# Patient Record
Sex: Male | Born: 1990 | Race: Black or African American | Hispanic: No | Marital: Single | Smoking: Never smoker
Health system: Southern US, Community
[De-identification: ages and names within clinical notes are randomized; demographics above are authoritative.]

---
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
## 2017-01-12 ENCOUNTER — Encounter (HOSPITAL_COMMUNITY): Payer: Self-pay | Admitting: *Deleted

## 2017-01-12 ENCOUNTER — Emergency Department (HOSPITAL_COMMUNITY): Payer: Non-veteran care

## 2017-01-12 ENCOUNTER — Emergency Department (HOSPITAL_COMMUNITY)
Admission: EM | Admit: 2017-01-12 | Discharge: 2017-01-12 | Disposition: A | Discharge status: Home / Self Care | Payer: Non-veteran care | Attending: Emergency Medicine | Admitting: Emergency Medicine

## 2017-01-12 DIAGNOSIS — W228XXA Striking against or struck by other objects, initial encounter: Secondary | ICD-10-CM | POA: Insufficient documentation

## 2017-01-12 DIAGNOSIS — S6991XA Unspecified injury of right wrist, hand and finger(s), initial encounter: Secondary | ICD-10-CM | POA: Diagnosis present

## 2017-01-12 DIAGNOSIS — Y929 Unspecified place or not applicable: Secondary | ICD-10-CM | POA: Diagnosis not present

## 2017-01-12 DIAGNOSIS — Y999 Unspecified external cause status: Secondary | ICD-10-CM | POA: Insufficient documentation

## 2017-01-12 DIAGNOSIS — S6010XA Contusion of unspecified finger with damage to nail, initial encounter: Secondary | ICD-10-CM

## 2017-01-12 DIAGNOSIS — S62600A Fracture of unspecified phalanx of right index finger, initial encounter for closed fracture: Secondary | ICD-10-CM

## 2017-01-12 DIAGNOSIS — Y939 Activity, unspecified: Secondary | ICD-10-CM | POA: Insufficient documentation

## 2017-01-12 DIAGNOSIS — S62630A Displaced fracture of distal phalanx of right index finger, initial encounter for closed fracture: Secondary | ICD-10-CM | POA: Insufficient documentation

## 2017-01-12 MED ORDER — IBUPROFEN 600 MG PO TABS: 600.0000 mg | ORAL_TABLET | Freq: Four times a day (QID) | ORAL | 0 refills | Status: AC

## 2017-01-12 MED ORDER — HYDROCODONE-ACETAMINOPHEN 5-325 MG PO TABS
2.0000 | ORAL_TABLET | Freq: Once | ORAL | Status: AC
  Administered 2017-01-12: 2 via ORAL
  Filled 2017-01-12: qty 2

## 2017-01-12 MED ORDER — IBUPROFEN 800 MG PO TABS
800.0000 mg | ORAL_TABLET | Freq: Once | ORAL | Status: AC
  Administered 2017-01-12: 800 mg via ORAL
  Filled 2017-01-12: qty 1

## 2017-01-12 MED ORDER — HYDROCODONE-ACETAMINOPHEN 5-325 MG PO TABS: 1.0000 | ORAL_TABLET | ORAL | 0 refills | Status: AC | PRN

## 2017-01-12 MED ORDER — ONDANSETRON HCL 4 MG PO TABS
4.0000 mg | ORAL_TABLET | Freq: Once | ORAL | Status: AC
  Administered 2017-01-12: 4 mg via ORAL
  Filled 2017-01-12: qty 1

## 2017-01-12 NOTE — ED Notes (Signed)
Patient transported to X-ray 

## 2017-01-12 NOTE — ED Triage Notes (Signed)
Pt shut his right pointer finger in the door of his car. Pt c/o pain and throbbing to his finger.

## 2017-01-12 NOTE — ED Provider Notes (Signed)
[facility]PT Provider Note   CSN: [id number] Arrival date & time: 01/12/17  2029     History   Chief Complaint Chief Complaint  Patient presents with  . Finger Injury    HPI Duane Ray is a 26 y.o. male.  Patient is a 26 year old male who presents to the emergency department with complaint of injury to the index finger.  Patient presents to the emergency department after shutting the right index finger in the door of his car. He complains of throbbing pain in the finger. No other injuries reported. The patient denies being on any anticoagulation medications. He denies any previous operations or procedures involving the right upper extremity. He has not taken anything for his discomfort up to this point.      History reviewed. No pertinent past medical history.  There are no active problems to display for this patient.   History reviewed. No pertinent surgical history.     Home Medications    Prior to Admission medications   Not on File    Family History History reviewed. No pertinent family history.  Social History Social History  Substance Use Topics  . Smoking status: Never Smoker  . Smokeless tobacco: Never Used  . Alcohol use No     Allergies   Patient has no known allergies.   Review of Systems Review of Systems  Constitutional: Negative for activity change and appetite change.  HENT: Negative for congestion, ear discharge, ear pain, facial swelling, nosebleeds, rhinorrhea, sneezing and tinnitus.   Eyes: Negative for photophobia, pain and discharge.  Respiratory: Negative for cough, choking, shortness of breath and wheezing.   Cardiovascular: Negative for chest pain, palpitations and leg swelling.  Gastrointestinal: Negative for abdominal pain, blood in stool, constipation, diarrhea, nausea and vomiting.  Genitourinary: Negative for difficulty urinating, dysuria, flank pain, frequency and hematuria.  Musculoskeletal: Negative for back  pain, gait problem, myalgias and neck pain.       Finger pain  Skin: Negative for color change, rash and wound.  Neurological: Negative for dizziness, seizures, syncope, facial asymmetry, speech difficulty, weakness and numbness.  Hematological: Negative for adenopathy. Does not bruise/bleed easily.  Psychiatric/Behavioral: Negative for agitation, confusion, hallucinations, self-injury and suicidal ideas. The patient is not nervous/anxious.      Physical Exam Updated Vital Signs BP 140/86 (BP Location: Left Arm)   Pulse (!) 56   Temp 98.5 F (36.9 C) (Oral)   Resp 18   Ht 6' (1.829 m)   Wt 67.1 kg   SpO2 100%   BMI 20.07 kg/m   Physical Exam  Constitutional: He is oriented to person, place, and time. He appears well-developed and well-nourished.  Non-toxic appearance.  HENT:  Head: Normocephalic.  Right Ear: Tympanic membrane and external ear normal.  Left Ear: Tympanic membrane and external ear normal.  Eyes: EOM and lids are normal. Pupils are equal, round, and reactive to light.  Neck: Normal range of motion. Neck supple. Carotid bruit is not present.  Cardiovascular: Normal rate, regular rhythm, normal heart sounds, intact distal pulses and normal pulses.   Pulmonary/Chest: Breath sounds normal. No respiratory distress.  Abdominal: Soft. Bowel sounds are normal. There is no tenderness. There is no guarding.  Musculoskeletal: Normal range of motion.  The patient has a subungual hematoma of the right index finger. There is tenderness to the distal tip. There is limited range of motion at the PIP and DIP joint. There is full range of motion of all other fingers, wrists, elbow,  and shoulder on the right. The radial pulses 2+.  Lymphadenopathy:       Head (right side): No submandibular adenopathy present.       Head (left side): No submandibular adenopathy present.    He has no cervical adenopathy.  Neurological: He is alert and oriented to person, place, and time. He has normal  strength. No cranial nerve deficit or sensory deficit.  Skin: Skin is warm and dry.  Psychiatric: He has a normal mood and affect. His speech is normal.  Nursing note and vitals reviewed.    ED Treatments / Results  Labs (all labs ordered are listed, but only abnormal results are displayed) Labs Reviewed - No data to display  EKG  EKG Interpretation None       Radiology No results found.  Procedures .Nail Removal Date/Time: 01/14/2017 10:38 AM Performed by: Ivery Quale Authorized by: Ivery Quale   Consent:    Consent obtained:  Verbal   Consent given by:  Patient   Risks discussed:  Infection and pain Location:    Hand:  R index finger Pre-procedure details:    Skin preparation:  Alcohol and Betadine   Preparation: Patient was prepped and draped in the usual sterile fashion   Anesthesia (see MAR for exact dosages):    Anesthesia method:  None Trephination:    Subungual hematoma drained: yes     Trephination instrument:  Needle Post-procedure details:    Dressing:  Gauze roll   Patient tolerance of procedure:  Tolerated well, no immediate complications .Splint Application Date/Time: 01/12/2017 9:19 PM Performed by: Ivery Quale Authorized by: Ivery Quale   Consent:    Consent obtained:  Verbal   Consent given by:  Patient   Risks discussed:  Pain and swelling Pre-procedure details:    Sensation:  Normal Procedure details:    Laterality:  Right   Location:  Finger   Finger:  R index finger   Splint type:  Finger   Supplies:  Aluminum splint Post-procedure details:    Pain:  Unchanged   Sensation:  Normal   Skin color:  Normal   Patient tolerance of procedure:  Tolerated well, no immediate complications   (including critical care time)  Medications Ordered in ED Medications - No data to display   Initial Impression / Assessment and Plan / ED Course  I have reviewed the triage vital signs and the nursing notes.  Pertinent labs &  imaging results that were available during my care of the patient were reviewed by me and considered in my medical decision making (see chart for details).      Final Clinical Impressions(s) / ED Diagnoses MDM He vital signs within normal limits. The patient has injury of the right index finger. Subungual hematoma evacuated. Splint applied for fx. I've advised patient to keep his hand elevated above his heart to improve his pain. The patient will be provided with an ice pack. Rx for pain medication given.   Final diagnoses:  Fracture of unspecified phalanx of right index finger, initial encounter for closed fracture  Subungual hematoma of digit of hand, initial encounter    New Prescriptions New Prescriptions   No medications on file     Ivery Quale, Cordelia Poche 01/14/17 1045    Bethann Berkshire, MD 01/14/17 1529

## 2017-01-12 NOTE — ED Notes (Signed)
PA at bedside.

## 2017-01-12 NOTE — Discharge Instructions (Signed)
Your x-ray suggests a fracture of the distal tuft of your finger. Please use the finger splint until seen by the orthopedic specialist Dr. Romeo AppleHarrison. You had a collection of blood under the nail called a subungual hematoma that was released. Please change the Band-Aid daily until the bleeding stops. Keep your hand elevated above your heart is much as possible. Use 600 mg of ibuprofen with breakfast, lunch, dinner, and at bedtime. Please useNorco every 4 hours as needed for pain.This medication may cause drowsiness. Please do not drink, drive, or participate in activity that requires concentration while taking this medication.

## 2017-01-12 NOTE — ED Notes (Signed)
Pt verbalized understanding of discharge instructions. Pt ambulatory to waiting room.  
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
## 2019-11-14 ENCOUNTER — Ambulatory Visit: Payer: No Typology Code available for payment source

## 2021-12-04 ENCOUNTER — Emergency Department (HOSPITAL_COMMUNITY)
Admission: EM | Admit: 2021-12-04 | Discharge: 2021-12-06 | Disposition: A | Discharge status: Other Acute Inpatient Hospital | Payer: No Typology Code available for payment source | Attending: Emergency Medicine | Admitting: Emergency Medicine

## 2021-12-04 ENCOUNTER — Encounter (HOSPITAL_COMMUNITY): Payer: Self-pay | Admitting: *Deleted

## 2021-12-04 DIAGNOSIS — R634 Abnormal weight loss: Secondary | ICD-10-CM | POA: Diagnosis not present

## 2021-12-04 DIAGNOSIS — R443 Hallucinations, unspecified: Secondary | ICD-10-CM

## 2021-12-04 DIAGNOSIS — Z20822 Contact with and (suspected) exposure to covid-19: Secondary | ICD-10-CM | POA: Insufficient documentation

## 2021-12-04 DIAGNOSIS — F321 Major depressive disorder, single episode, moderate: Secondary | ICD-10-CM | POA: Diagnosis not present

## 2021-12-04 LAB — COMPREHENSIVE METABOLIC PANEL
ALT: 16 U/L (ref 0–44)
AST: 25 U/L (ref 15–41)
Albumin: 5.2 g/dL — ABNORMAL HIGH (ref 3.5–5.0)
Alkaline Phosphatase: 54 U/L (ref 38–126)
Anion gap: 11 (ref 5–15)
BUN: 8 mg/dL (ref 6–20)
CO2: 25 mmol/L (ref 22–32)
Calcium: 9.8 mg/dL (ref 8.9–10.3)
Chloride: 105 mmol/L (ref 98–111)
Creatinine, Ser: 0.83 mg/dL (ref 0.61–1.24)
GFR, Estimated: >60 mL/min (ref >60)
Glucose, Bld: 99 mg/dL (ref 70–99)
Potassium: 3.9 mmol/L (ref 3.5–5.1)
Sodium: 141 mmol/L (ref 135–145)
Total Bilirubin: 0.7 mg/dL (ref 0.3–1.2)
Total Protein: 8.4 g/dL — ABNORMAL HIGH (ref 6.5–8.1)

## 2021-12-04 LAB — CBC WITH DIFFERENTIAL/PLATELET
Abs Immature Granulocytes: 0.02 10*3/uL (ref 0.00–0.07)
Basophils Absolute: 0 10*3/uL (ref 0.0–0.1)
Basophils Relative: 0 %
Eosinophils Absolute: 0 10*3/uL (ref 0.0–0.5)
Eosinophils Relative: 0 %
HCT: 42.1 % (ref 39.0–52.0)
Hemoglobin: 13.9 g/dL (ref 13.0–17.0)
Immature Granulocytes: 0 %
Lymphocytes Relative: 15 %
Lymphs Abs: 1.8 10*3/uL (ref 0.7–4.0)
MCH: 30.7 pg (ref 26.0–34.0)
MCHC: 33 g/dL (ref 30.0–36.0)
MCV: 92.9 fL (ref 80.0–100.0)
Monocytes Absolute: 0.9 10*3/uL (ref 0.1–1.0)
Monocytes Relative: 7 %
Neutro Abs: 9.2 10*3/uL — ABNORMAL HIGH (ref 1.7–7.7)
Neutrophils Relative %: 78 %
Platelets: 277 10*3/uL (ref 150–400)
RBC: 4.53 MIL/uL (ref 4.22–5.81)
RDW: 12.6 % (ref 11.5–15.5)
WBC: 11.9 10*3/uL — ABNORMAL HIGH (ref 4.0–10.5)
nRBC: 0 % (ref 0.0–0.2)

## 2021-12-04 LAB — RAPID URINE DRUG SCREEN, HOSP PERFORMED
Amphetamines: NOT DETECTED
Barbiturates: NOT DETECTED
Benzodiazepines: NOT DETECTED
Cocaine: NOT DETECTED
Opiates: NOT DETECTED
Tetrahydrocannabinol: POSITIVE — AB

## 2021-12-04 LAB — ETHANOL: Alcohol, Ethyl (B): <10 mg/dL (ref <10)

## 2021-12-04 NOTE — ED Triage Notes (Signed)
WANDED BY SECURITY IN TRIAGE ?

## 2021-12-04 NOTE — ED Notes (Signed)
Pt belongings placed in locker and pt dressed in hospital attire. Pt wanded by secruity at this time. ?

## 2021-12-04 NOTE — ED Triage Notes (Signed)
Brought in for IVC EVALUATION ?

## 2021-12-04 NOTE — ED Notes (Signed)
Pt is standing and pacing in small circle. Pt seems cooperative at this time and had not acted out. Will continue to monitor.  ?
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
## 2021-12-05 ENCOUNTER — Other Ambulatory Visit: Payer: Self-pay

## 2021-12-05 NOTE — ED Notes (Signed)
Per [facility] Curlene Dolphin) awaiting call back from pt's father, in the meantime pt will need continuous observation.  ?Pt continues to rest, NAD noted, even RR and unlabored, sitter remains at the bedside for safety precautions.  ?

## 2021-12-05 NOTE — ED Notes (Signed)
Calm, cooperative. No complaints. No signs of distress. Remains in view of sitter.  ?

## 2021-12-05 NOTE — ED Notes (Signed)
Pt continues to rest, lights on low, sitter at the bedside for safety, NAD noted, POC in place, will continue to monitor  ? ?

## 2021-12-05 NOTE — ED Notes (Signed)
Pt appears to be sleeping, eyes closed, even and unlabored RR, skin warm and dry, sitter at the bedside for safety monitoring, NAD noted, POC to be continued.  ?

## 2021-12-05 NOTE — ED Notes (Signed)
Patient remains calm and cooperative. Awaiting placement. No signs of distress. A&O x 4. Walking around in room. Gait steady.  ?

## 2021-12-05 NOTE — ED Notes (Signed)
In view of sitter. Calm, cooperative. No signs of distress. No complaints. Lunch tray provided. Tolerated well.  ?

## 2021-12-05 NOTE — Progress Notes (Signed)
CSW communicated with nursing Marcha Solders, RN and Alona Bene, MD in regards to completing [facility] for [facility] including: [facility] Information Sheet, and Provider Certification and Patient Consent for Transfer. CSW completed Transfer sheet and printed supporting documentation. CSW faxed forms to [phone] to [facility]. CSW received forms back and requested that Consent for Transfer form be updated due to provider completing wrong section. CSW sent nursing a message and requested that form be updated correctly to send to [place]. Currently, CSW is waiting on consent for transfer form back from [facility]. CSW will assist and follow with referral process.  ? ? ?Maryjean Ka, MSW, LCSWA ?12/05/2021 11:46 PM ? ? ?

## 2021-12-05 NOTE — ED Notes (Signed)
Calm, cooperative. No signs of distress. Phone given to make phone call to patient's work place. Patient concerned he may lose his job while he is here.  ?

## 2021-12-05 NOTE — ED Notes (Signed)
Patient is now being recommended for inpatient psychiatric care per TSS ?

## 2021-12-05 NOTE — ED Notes (Signed)
Pt continues to rest, NAD noted, even RR and unlabored, sitter remains at the bedside for safety precautions.  ?

## 2021-12-05 NOTE — ED Notes (Signed)
Calm, cooperative. No complaints. No signs of distress. Remains in view of sitter.  ?
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
## 2021-12-05 NOTE — ED Notes (Signed)
Pt ambulated to RN station, steady gait, pt given update of status, pt verbalized understanding, pt back to stretcher, NAD noted ?

## 2021-12-05 NOTE — Progress Notes (Signed)
Pt has been denied at [facility] per [facility] Fransico Michael, RN. CSW called [facility] and was advised that CSW can send referral and referral will be reviewed in the am per AOD.  ? ? ?Maryjean Ka, MSW, LCSWA ?12/05/2021 9:22 PM ? ? ?
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
## 2021-12-05 NOTE — ED Notes (Signed)
Pt resting in bed, lights on low, sitter at the bedside for safety, NAD noted, POC in place, will continue to monitor  ?

## 2021-12-05 NOTE — ED Notes (Signed)
Ambulatory to bathroom. Calm, cooperative. No signs of distress.  ?

## 2021-12-06 LAB — RESP PANEL BY RT-PCR (FLU A&B, COVID) ARPGX2
Influenza A by PCR: NEGATIVE
Influenza B by PCR: NEGATIVE
SARS Coronavirus 2 by RT PCR: NEGATIVE

## 2021-12-06 NOTE — Progress Notes (Signed)
CSW spoke with April from the [facility] regarding admissions. April confirmed she received the [facility] referral but has requested additional information such as IVC paperwork, COVID results, updated vitals, and MAR. CSW communicated this updated with the assigned ED RN and has agreed to fax over requested documents.  ? ? ?Damita Dunnings, MSW, LCSW-A  ?10:04 AM 12/06/2021   ?
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
## 2021-12-06 NOTE — Progress Notes (Signed)
CSW coordinated with Marcha Solders, RN and Ross Marcus, MD with completing needed forms for [facility] referral. CSW sent completed Transfer Information sheet and consent for Transfer form along with supporting documentation for [facility] Intake via fax [phone] and [phone] to be reviewed. 1st shift to follow up in the AM.  ? ?Maryjean Ka, MSW, LCSWA ?12/06/2021 12:36 AM ? ?  ?
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
## 2021-12-06 NOTE — ED Provider Notes (Signed)
Patient is being transferred to [facility], ED building 2.  Kipp Brood [place] excepting ?  ?Bethann Berkshire, MD ?12/06/21 1352 ? ?

## 2021-12-06 NOTE — Progress Notes (Signed)
[facility] LCSW Progress Note ?  ?12/06/2021    1:41 PM ? ?Duane Ray  ? ?[id number]  ? ?Type of Contact and Topic:  Psychiatric Bed Placement  ? ?Pt accepted to [facility]   ? ?Patient meets inpatient criteria per Melbourne Abts, PA-C   ? ?The attending provider will be Sherol Dade, NP and Kipp Brood [place],MD ? ?Call report to [phone] extension [phone] or [phone] ? ?Fransico Him, RN @ [facility] ED notified.    ? ?Pt scheduled  to arrive at [facility] today before 7 PM. Please do not call report until after the patient leaves [facility].  ? ? ?Damita Dunnings, MSW, LCSW-A  ?1:44 PM 12/06/2021   ?  ? ?  ?  ? ? ? ? ?  ?

## 2021-12-06 NOTE — ED Notes (Signed)
Attempted to call report x3. No answer at this time.  ?
